# Patient Record
Sex: Female | Born: 1960 | Race: White | Hispanic: No | Marital: Single | Smoking: Never smoker
Health system: Southern US, Community
[De-identification: ages and names within clinical notes are randomized; demographics above are authoritative.]

---

## 1999-11-16 ENCOUNTER — Other Ambulatory Visit: Admission: RE | Admit: 1999-11-16 | Discharge: 1999-11-16 | Payer: Self-pay | Admitting: Family Medicine

## 2000-11-19 ENCOUNTER — Other Ambulatory Visit: Admission: RE | Admit: 2000-11-19 | Discharge: 2000-11-19 | Payer: Self-pay | Admitting: Family Medicine

## 2001-11-22 ENCOUNTER — Other Ambulatory Visit: Admission: RE | Admit: 2001-11-22 | Discharge: 2001-11-22 | Payer: Self-pay | Admitting: Family Medicine

## 2003-01-29 ENCOUNTER — Encounter: Admission: RE | Admit: 2003-01-29 | Discharge: 2003-01-29 | Payer: Self-pay | Admitting: Family Medicine

## 2003-01-29 ENCOUNTER — Encounter: Payer: Self-pay | Admitting: Family Medicine

## 2003-12-21 ENCOUNTER — Other Ambulatory Visit: Admission: RE | Admit: 2003-12-21 | Discharge: 2003-12-21 | Payer: Self-pay | Admitting: Family Medicine

## 2005-01-31 ENCOUNTER — Other Ambulatory Visit: Admission: RE | Admit: 2005-01-31 | Discharge: 2005-01-31 | Payer: Self-pay | Admitting: Family Medicine

## 2006-04-02 ENCOUNTER — Other Ambulatory Visit: Admission: RE | Admit: 2006-04-02 | Discharge: 2006-04-02 | Payer: Self-pay | Admitting: Family Medicine

## 2007-05-21 ENCOUNTER — Other Ambulatory Visit: Admission: RE | Admit: 2007-05-21 | Discharge: 2007-05-21 | Payer: Self-pay | Admitting: Family Medicine

## 2009-01-07 ENCOUNTER — Other Ambulatory Visit: Admission: RE | Admit: 2009-01-07 | Discharge: 2009-01-07 | Payer: Self-pay | Admitting: Family Medicine

## 2018-03-09 ENCOUNTER — Encounter (HOSPITAL_COMMUNITY): Payer: Self-pay | Admitting: Emergency Medicine

## 2018-03-09 ENCOUNTER — Emergency Department (HOSPITAL_COMMUNITY)
Admission: EM | Admit: 2018-03-09 | Discharge: 2018-03-09 | Disposition: A | Discharge status: Home / Self Care | Payer: Self-pay | Attending: Emergency Medicine | Admitting: Emergency Medicine

## 2018-03-09 ENCOUNTER — Emergency Department (HOSPITAL_COMMUNITY): Payer: Self-pay

## 2018-03-09 DIAGNOSIS — G8929 Other chronic pain: Secondary | ICD-10-CM | POA: Insufficient documentation

## 2018-03-09 DIAGNOSIS — Y939 Activity, unspecified: Secondary | ICD-10-CM | POA: Insufficient documentation

## 2018-03-09 DIAGNOSIS — M25511 Pain in right shoulder: Secondary | ICD-10-CM

## 2018-03-09 DIAGNOSIS — R1110 Vomiting, unspecified: Secondary | ICD-10-CM | POA: Insufficient documentation

## 2018-03-09 DIAGNOSIS — Y999 Unspecified external cause status: Secondary | ICD-10-CM | POA: Insufficient documentation

## 2018-03-09 DIAGNOSIS — M19011 Primary osteoarthritis, right shoulder: Secondary | ICD-10-CM | POA: Insufficient documentation

## 2018-03-09 DIAGNOSIS — X58XXXA Exposure to other specified factors, initial encounter: Secondary | ICD-10-CM | POA: Insufficient documentation

## 2018-03-09 DIAGNOSIS — S91001A Unspecified open wound, right ankle, initial encounter: Secondary | ICD-10-CM | POA: Insufficient documentation

## 2018-03-09 DIAGNOSIS — Y929 Unspecified place or not applicable: Secondary | ICD-10-CM | POA: Insufficient documentation

## 2018-03-09 DIAGNOSIS — F10920 Alcohol use, unspecified with intoxication, uncomplicated: Secondary | ICD-10-CM | POA: Insufficient documentation

## 2018-03-09 DIAGNOSIS — M25571 Pain in right ankle and joints of right foot: Secondary | ICD-10-CM | POA: Insufficient documentation

## 2018-03-09 MED ORDER — ONDANSETRON HCL 4 MG/2ML IJ SOLN: 4.0000 mg | Freq: Once | INTRAMUSCULAR | Status: DC

## 2018-03-09 MED ORDER — SULFAMETHOXAZOLE-TRIMETHOPRIM 800-160 MG PO TABS: 1.0000 | ORAL_TABLET | Freq: Two times a day (BID) | ORAL | 0 refills | Status: AC

## 2018-03-09 MED ORDER — ONDANSETRON 8 MG PO TBDP
8.0000 mg | ORAL_TABLET | Freq: Once | ORAL | Status: AC
  Administered 2018-03-09: 8 mg via ORAL
  Filled 2018-03-09: qty 1

## 2018-03-09 NOTE — ED Triage Notes (Signed)
[place] [facility] [place] with complaints of right shoulder and right ankle pain x3 months. ETOH.

## 2018-03-09 NOTE — ED Provider Notes (Signed)
[facility]-EMERGENCY DEPT Provider Note   CSN: [id number] Arrival date & time: 03/09/18  1556     History   Chief Complaint Chief Complaint  Patient presents with  . Alcohol Intoxication  . Ankle Pain    HPI Monique Freeman is a 57 y.o. female with a PMHx of esophageal stricture, neuropathy in feet, and ?CVA in November 2018 (in [place]), who presents to the ED with complaints of R shoulder pain for about 1 month.  LEVEL 5 CAVEAT due to pt being a relatively poor historian.  Patient states that she just recently moved back from [place], she states that she was there last year and had possibly a stroke though she is not sure, she states that she was in the hospital in November for this possible stroke.  She decided to move back here a few days ago.  She came in today because she is having chronic right shoulder pain that has been going on for at least a month.  She states that she fell out of a wheelchair approximately a month ago, but denies any new or recent injuries since then.  She describes the pain as 10/10 constant stabbing nonradiating right shoulder pain that worsens with movement and has been unrelieved with ibuprofen.  She says she had xrays in [place] but she isn't sure what they showed.  She also complains of a wound on her right ankle, she states that last week in [place] she was given an antibiotic (possibly amoxicillin?) for this wound but she lost the rx 2 days ago when she lost her purse.  She denies any ongoing drainage to the area, which had previously been present (at least she was told she had drainage when she got the antibiotic).  She denies any warmth or erythema to the area that she's aware of.  She states that she has a chronic issue with vomiting because of an esophagus issue (?strictures) that she needs to have "dilated"; reports that she vomited 3 times this morning, NBNB, which occurred when she ate some food this morning; she states this is a chronic  issue which is unchanged.  She reports that she's homeless, tried to go to the shelters but hasn't had a place to stay since a few days ago.  She admits that she had a 28oz beer around 3pm today.  She denies SI/HI/AVH or illicit drug use. Denies being a tobacco user.  Denies having any other medical conditions that she's aware of, but again she is a fairly poor historian and therefore this limits evaluation somewhat.  She denies fevers, chills, CP, SOB, abd pain, nausea, diarrhea, constipation, hematemesis, hematuria, dysuria, new/worsening numbness/tingling, focal weakness, or any other complaints at this time.   The history is provided by the patient and medical records. No language interpreter was used.  Alcohol Intoxication  Pertinent negatives include no chest pain, no abdominal pain and no shortness of breath.  Ankle Pain   Pertinent negatives include no numbness.    History reviewed. No pertinent past medical history.  There are no active problems to display for this patient.   History reviewed. No pertinent surgical history.   OB History   None      Home Medications    Prior to Admission medications   Not on File    Family History No family history on file.  Social History Social History   Tobacco Use  . Smoking status: Never Smoker  . Smokeless tobacco: Never Used  Substance Use Topics  . Alcohol use: Yes    Frequency: Never  . Drug use: Not on file     Allergies   Patient has no allergy information on record.   Review of Systems Review of Systems  Constitutional: Negative for chills and fever.  Respiratory: Negative for shortness of breath.   Cardiovascular: Negative for chest pain.  Gastrointestinal: Positive for vomiting. Negative for abdominal pain, constipation, diarrhea and nausea.  Genitourinary: Negative for dysuria and hematuria.  Musculoskeletal: Positive for arthralgias. Negative for joint swelling and myalgias.  Skin: Positive for wound.  Negative for color change.  Allergic/Immunologic: Negative for immunocompromised state.  Neurological: Negative for weakness and numbness.  Psychiatric/Behavioral: Negative for hallucinations and suicidal ideas.   All other systems reviewed and are negative for acute change except as noted in the HPI.    Physical Exam Updated Vital Signs BP 118/74 (BP Location: Left Arm)   Pulse 75   Temp 98.3 F (36.8 C) (Oral)   Resp 18   SpO2 94%   Physical Exam  Constitutional: She is oriented to person, place, and time. Vital signs are normal. She appears well-developed and well-nourished.  Non-toxic appearance. No distress.  Afebrile, nontoxic, NAD  HENT:  Head: Normocephalic and atraumatic.  Mouth/Throat: Oropharynx is clear and moist and mucous membranes are normal.  Eyes: Conjunctivae and EOM are normal. Right eye exhibits no discharge. Left eye exhibits no discharge.  Neck: Normal range of motion. Neck supple.  Cardiovascular: Normal rate, regular rhythm, normal heart sounds and intact distal pulses. Exam reveals no gallop and no friction rub.  No murmur heard. Pulmonary/Chest: Effort normal and breath sounds normal. No respiratory distress. She has no decreased breath sounds. She has no wheezes. She has no rhonchi. She has no rales.  Abdominal: Soft. Normal appearance and bowel sounds are normal. She exhibits no distension. There is no tenderness. There is no rigidity, no rebound, no guarding, no CVA tenderness, no tenderness at McBurney's point and negative Murphy's sign.  Musculoskeletal: Normal range of motion.       Right shoulder: She exhibits tenderness. She exhibits normal range of motion, no swelling, no effusion, no crepitus, no deformity, normal pulse and normal strength.       Right ankle: She exhibits swelling. She exhibits normal range of motion, no ecchymosis, no deformity and normal pulse. Tenderness. Lateral malleolus tenderness found. Achilles tendon normal.  R shoulder with  FROM intact, with mild diffuse TTP around the entire shoulder, no swelling/effusion, no bruising or erythema, no warmth, no crepitus/deformity, negative apley scratch, neg pain with resisted int/ext rotation.  R ankle with FROM intact, with small scab to lateral malleolus with minimal amount of surrounding swelling and faint erythema, no warmth or drainage, no red streaking, no crepitus or deformity, with mild TTP of the lateral malleolus above the scab, but no TTP or swelling of fore foot or calf. No bruising. Achilles intact. Good pedal pulse and cap refill of all toes. Wiggling toes without difficulty.  Strength and sensation grossly intact in all extremities, distal pulses intact.  Soft compartments.   Neurological: She is alert and oriented to person, place, and time. She has normal strength. No sensory deficit.  A&O x3 however seems slightly intoxicated. Speech clear and coherent, not slurred. No focal neuro deficits noted.   Skin: Skin is warm, dry and intact. No rash noted.  Small scab to R ankle as mentioned above  Psychiatric: Her affect is blunt. She is not actively hallucinating.  She expresses no homicidal and no suicidal ideation. She expresses no suicidal plans and no homicidal plans.  Flat affect, but pleasant and cooperative. Denies SI, HI, or AVH, doesn't seem to be responding to internal stimuli.   Nursing note and vitals reviewed.    ED Treatments / Results  Labs (all labs ordered are listed, but only abnormal results are displayed) Labs Reviewed - No data to display  EKG None  Radiology Dg Shoulder Right  Result Date: 03/09/2018 CLINICAL DATA:  Right shoulder pain. EXAM: RIGHT SHOULDER - 2+ VIEW COMPARISON:  None. FINDINGS: There is no evidence of fracture or dislocation. Osteoarthritic changes at the glenohumeral joint and acromioclavicular joint, moderate in severity. Soft tissues are unremarkable. IMPRESSION: No acute fracture or dislocation identified about the right  shoulder. Osteoarthritic changes of the glenohumeral and acromioclavicular joints. Electronically Signed   By: Ted Mcalpine M.D.   On: 03/09/2018 17:45   Dg Ankle Complete Right  Result Date: 03/09/2018 CLINICAL DATA:  Right ankle pain. EXAM: RIGHT ANKLE - COMPLETE 3+ VIEW COMPARISON:  None. FINDINGS: Post sideplate and screw fixation of the lateral malleolus. No evidence of hardware complications. No evidence of acute fracture or dislocation. Mild soft tissue swelling about the lateral malleolus. IMPRESSION: No evidence of acute fracture or dislocation. Prior sideplate and screw fixation of the lateral malleolus. Mild soft tissue swelling about the lateral malleolus. Electronically Signed   By: Ted Mcalpine M.D.   On: 03/09/2018 17:47    Procedures Procedures (including critical care time)  Medications Ordered in ED Medications - No data to display   Initial Impression / Assessment and Plan / ED Course  I have reviewed the triage vital signs and the nursing notes.  Pertinent labs & imaging results that were available during my care of the patient were reviewed by me and considered in my medical decision making (see chart for details).     57 y.o. female here with EtOH and chronic R shoulder/ankle pain. States she she recently moved here from [place], has no where to stay; this pain has been chronic, fell from wheelchair maybe a month ago but she's not sure. Was given abx for a wound on R ankle last week, but lost it after taking it for a week. Has esophagus issue where she will vomit food because she needs dilation done, vomited today but has no nausea or abdominal pain. On exam, mild TTP diffusely around the shoulder, FROM intact, no bruising/swelling/deformity/crepitus, mild TTP to lateral malleolus of R ankle with small scab and faint erythema, no warmth or drainage, minimal swelling, extremities all NVI with soft compartments, pt A&O x3 however appears intoxicated. Abd soft and  nontender. She denies SI/HI/AVH. Will get xrays of R ankle and shoulder and attempt to get in touch with social work/case manager to see if we can get any assistance for her. Will hold off on labs, will give food/drink to ensure she can tolerate it here, and reassess shortly.   6:32 PM R ankle xray with mild soft tissue swelling around lateral malleolus but otherwise no acute findings. Given wound on ankle, will treat for infection, although no definite evidence of cellulitis-- however she had incomplete tx thus far so will ensure full treatment now. R shoulder xray with no acute findings, osteoarthritic changes of the glenohumeral and acromioclavicular joints. Pt eating well here, tolerating PO without ongoing vomiting. She is able to ambulate without difficulty and is clinically sober. Unable to get ahold of case manager/social work since  they're gone for the day, but advised pt to call on Monday to discuss with them whether there is anything they can assist with. Will give list of shelters in the area to help with homelessness. Advised RICE, tylenol/motrin for pain, and proper wound care of her ankle wound. Will send home with abx. Discussed alcohol cessation. F/up with [facility] in 5-7 days to establish care and recheck wound. I explained the diagnosis and have given explicit precautions to return to the ER including for any other new or worsening symptoms. The patient understands and accepts the medical plan as it's been dictated and I have answered their questions. Discharge instructions concerning home care and prescriptions have been given. The patient is STABLE and is discharged to home in good condition.    Final Clinical Impressions(s) / ED Diagnoses   Final diagnoses:  Alcoholic intoxication without complication (HCC)  Chronic right shoulder pain  Osteoarthritis of right shoulder, unspecified osteoarthritis type  Chronic pain of right ankle  Wound of right ankle, initial encounter    ED  Discharge Orders        Ordered    sulfamethoxazole-trimethoprim (BACTRIM DS,SEPTRA DS) 800-160 MG tablet  2 times daily     03/09/18 [address] [place], [place] 03/09/18 1832    Arby Barrette, MD 03/15/18 2311

## 2018-03-09 NOTE — Discharge Instructions (Addendum)
Keep wound clean and dry. Ice and elevate the areas of pain, to help with pain and swelling. Take antibiotic until it is finished. Alternate between tylenol and ibuprofen as needed for pain. Use the list below to find shelters in the area. STOP DRINKING ALCOHOL! Follow-up with [facility] in 5-7 days for wound recheck and to establish medical care. Monitor area for signs of infection to include, but not limited to: increasing pain, spreading redness, drainage/pus, worsening swelling, or fevers. Return to emergency department for emergent changing or worsening symptoms.  You can call on Monday morning to speak with a case manager or social worker to find out if there is any assistance they can provide you with your homelessness issue.

## 2018-03-09 NOTE — ED Notes (Signed)
Patient was given meal tray.  

## 2018-03-09 NOTE — ED Notes (Signed)
Patient became sick on stomach.  PA is aware and recommended I give her a ginger ale to see if she can keep it down.

## 2018-12-02 IMAGING — CR DG SHOULDER 2+V*R*
3 series · 3 of 3 positions shown · non-contrast
Comparison: None.

CLINICAL DATA: Right shoulder pain.

EXAM:
RIGHT SHOULDER - 2+ VIEW

[x shoulder ap right (1 of 3)]
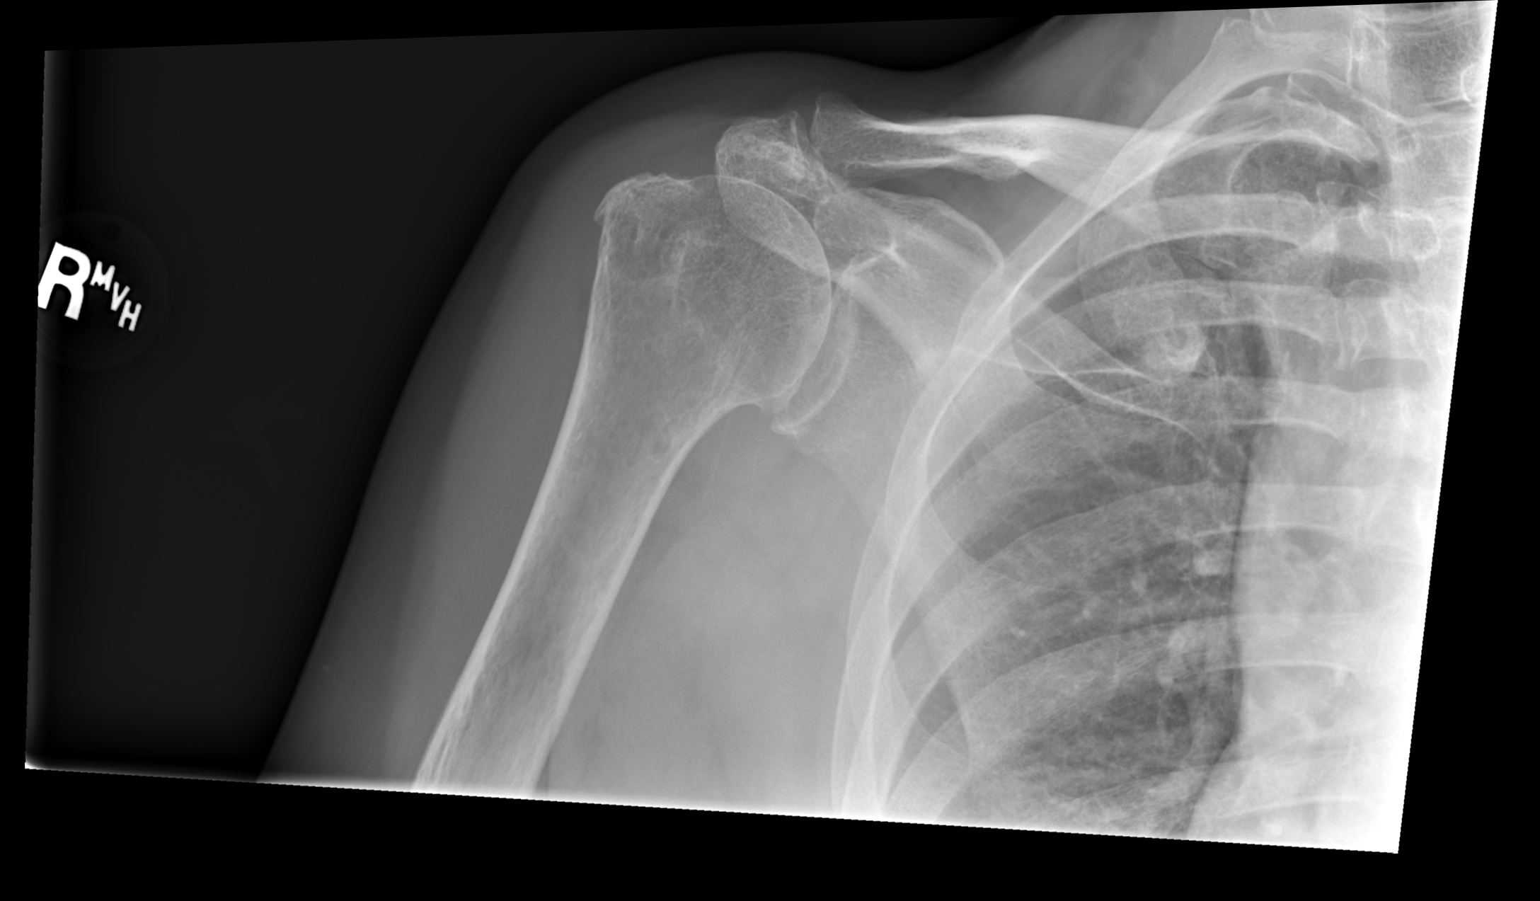

[x shoulder ap right (2 of 3)]
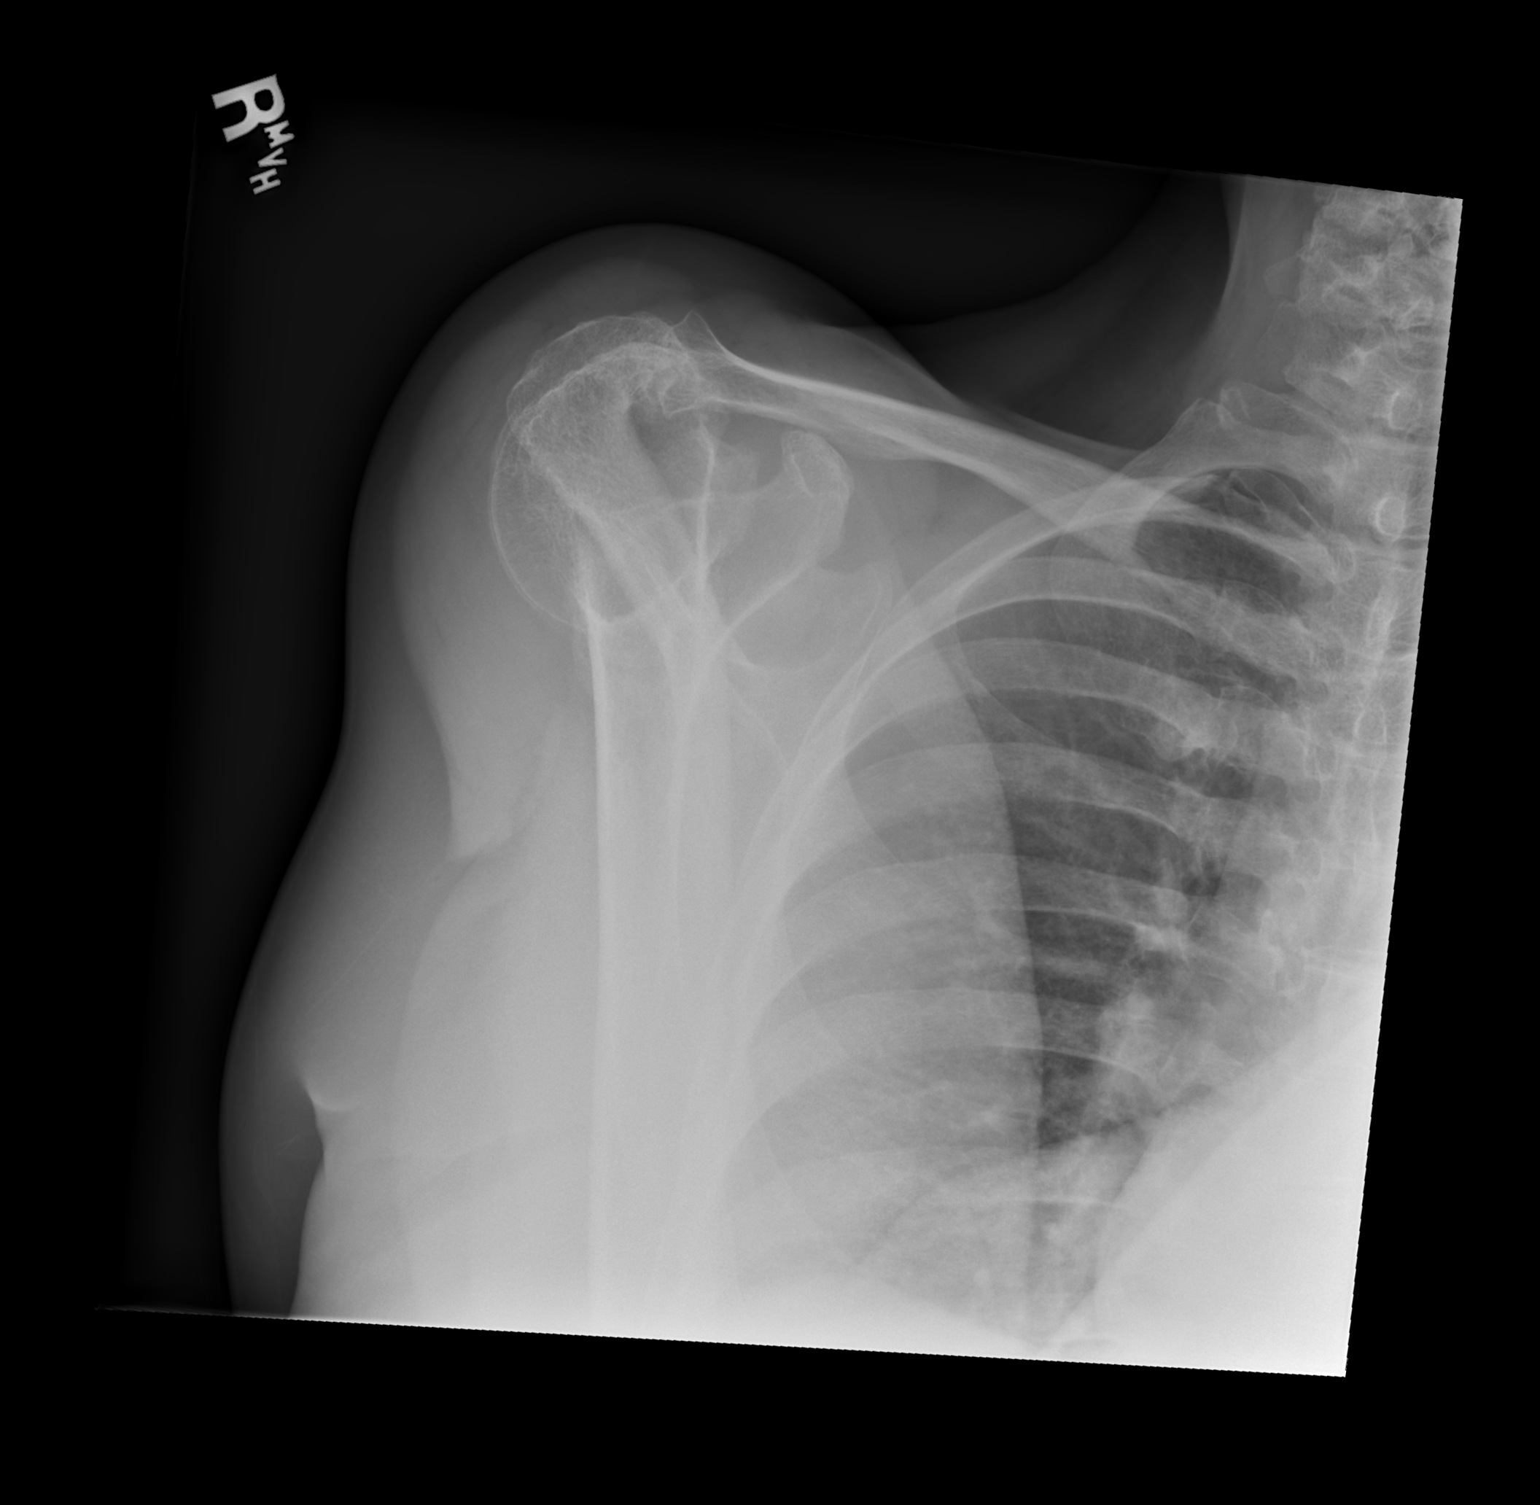

[x shoulder ap right (3 of 3)]
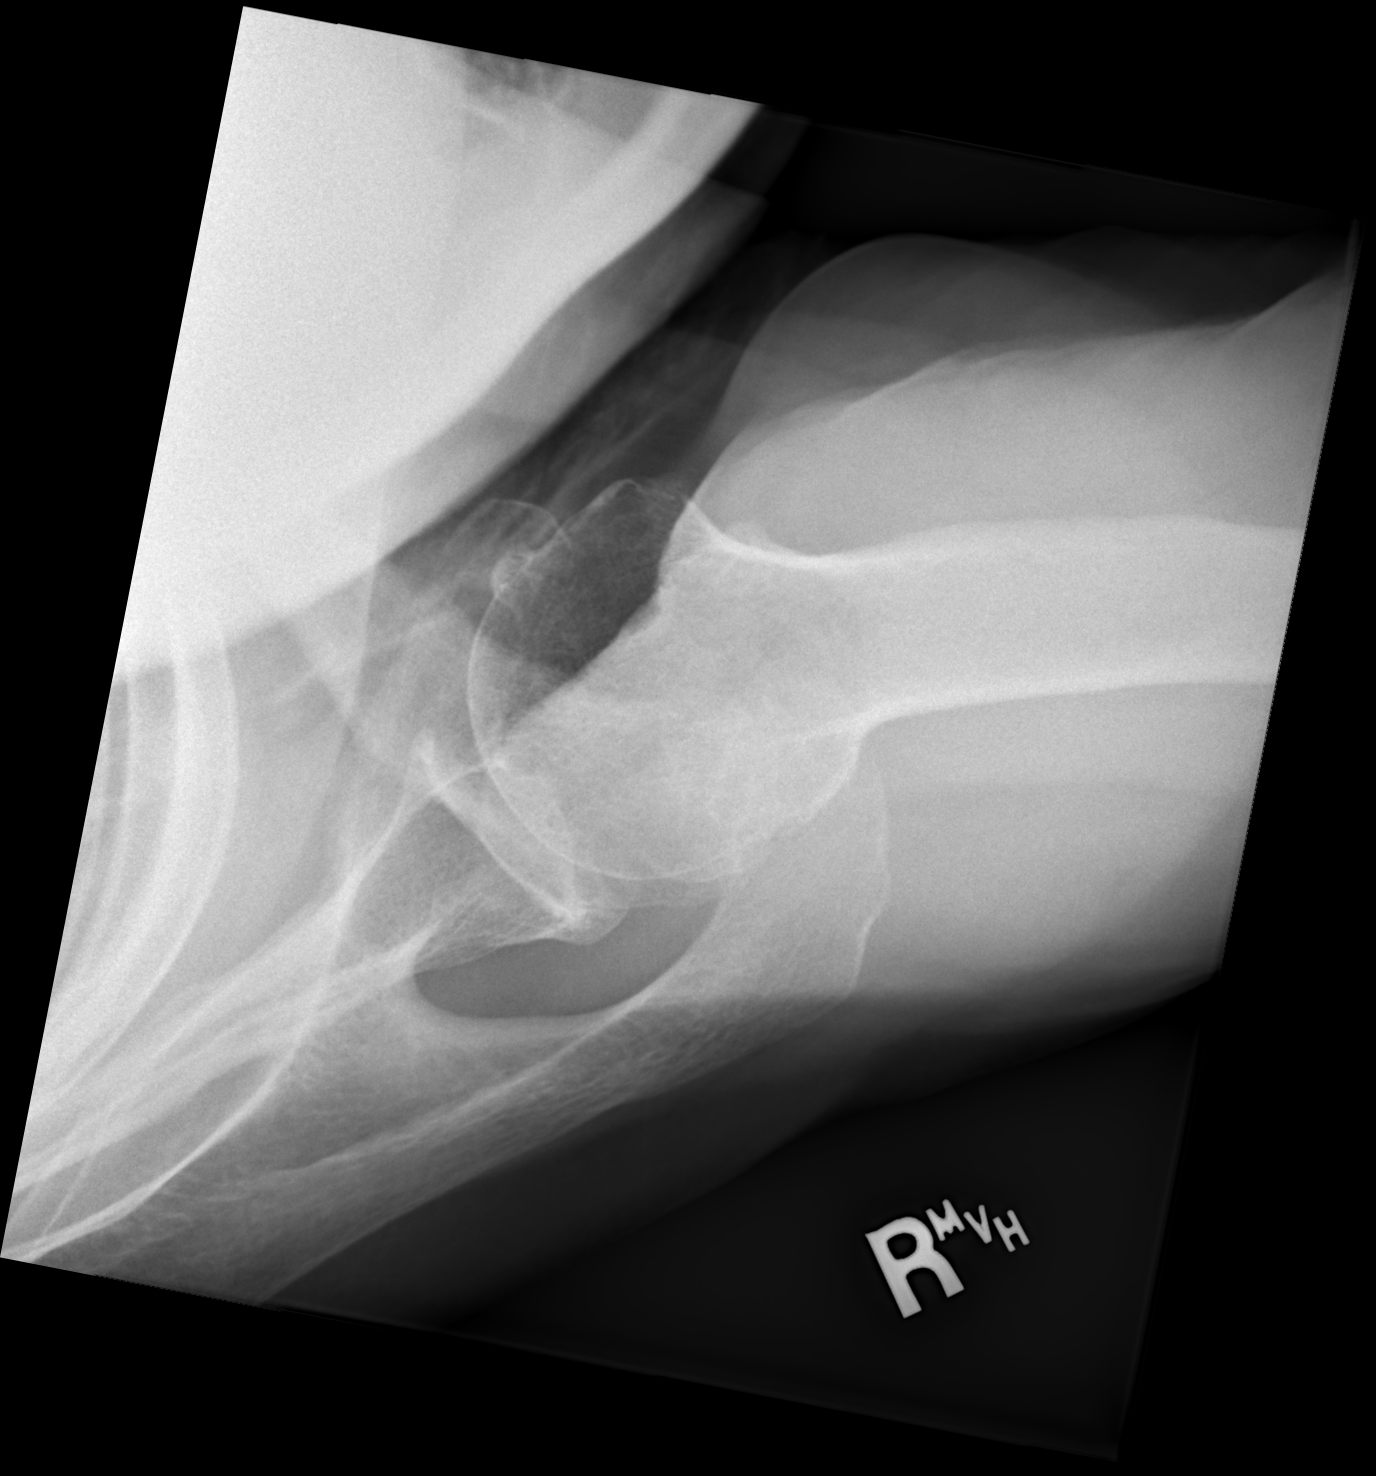

[3 of 3 positions shown; findings below may reference images not displayed]

FINDINGS: There is no evidence of fracture or dislocation. Osteoarthritic
changes at the glenohumeral joint and acromioclavicular joint,
moderate in severity. Soft tissues are unremarkable.
IMPRESSION: No acute fracture or dislocation identified about the right
shoulder.

Osteoarthritic changes of the glenohumeral and acromioclavicular
joints.

## 2018-12-02 IMAGING — CR DG ANKLE COMPLETE 3+V*R*
3 series · 3 of 3 positions shown · non-contrast
Comparison: None.

CLINICAL DATA: Right ankle pain.

EXAM:
RIGHT ANKLE - COMPLETE 3+ VIEW

[x ankle ap right]
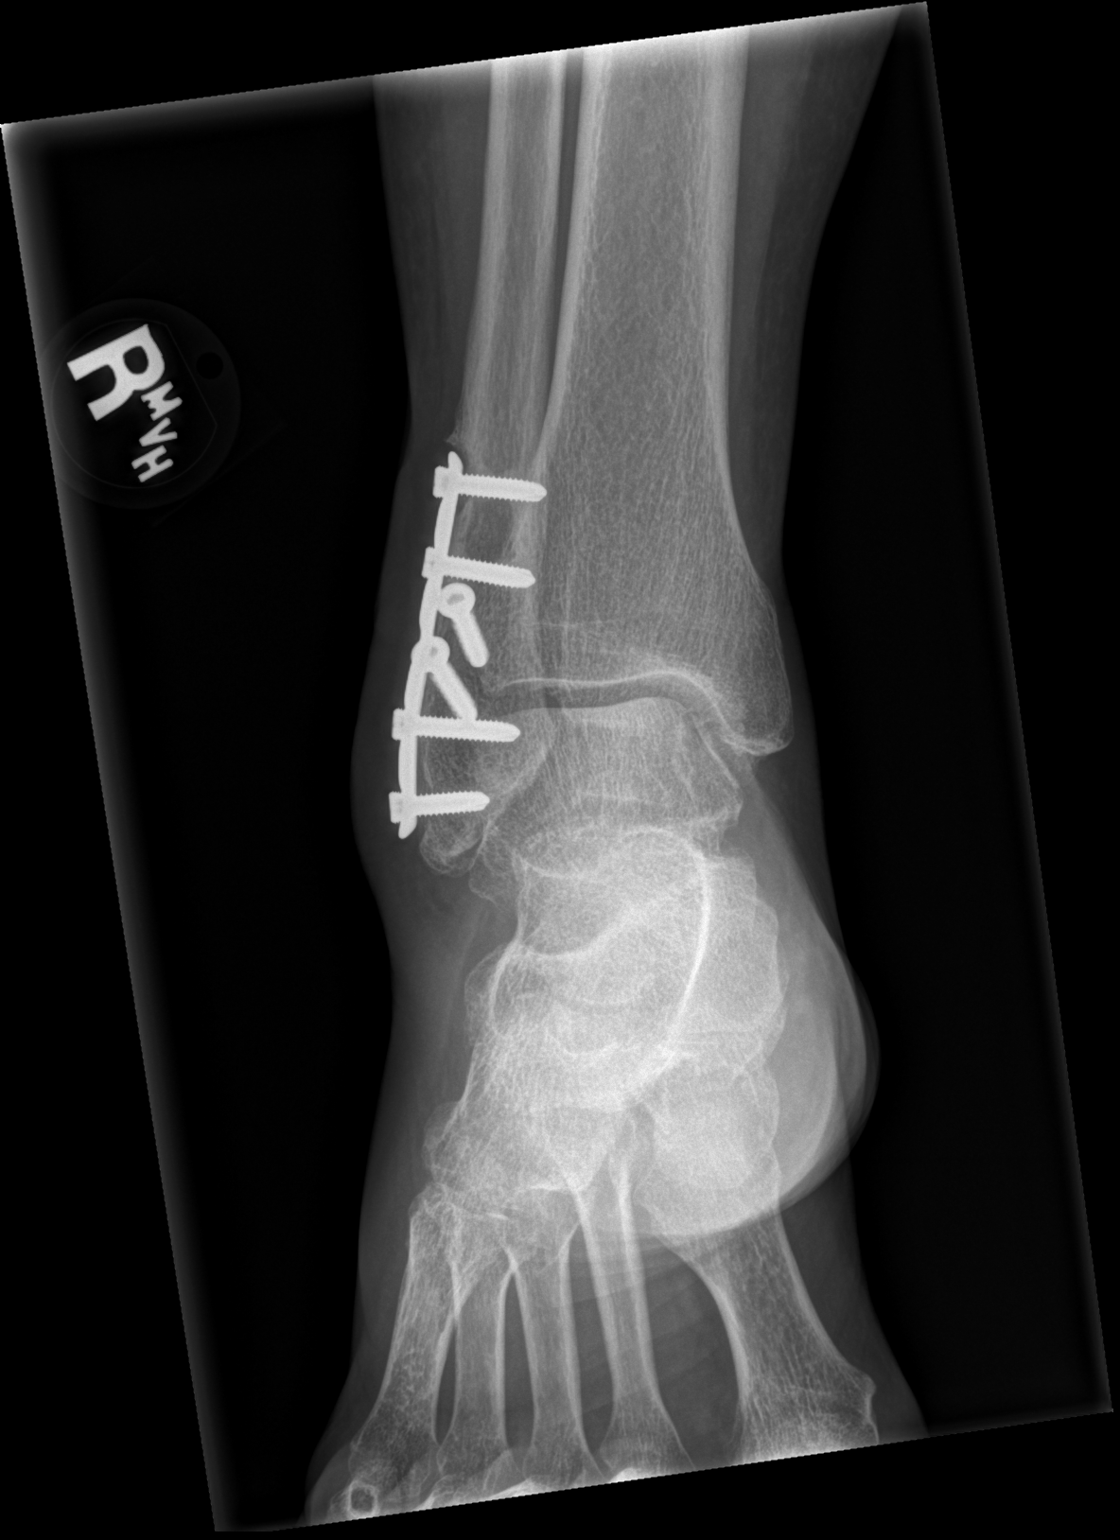

[x ankle obl right]
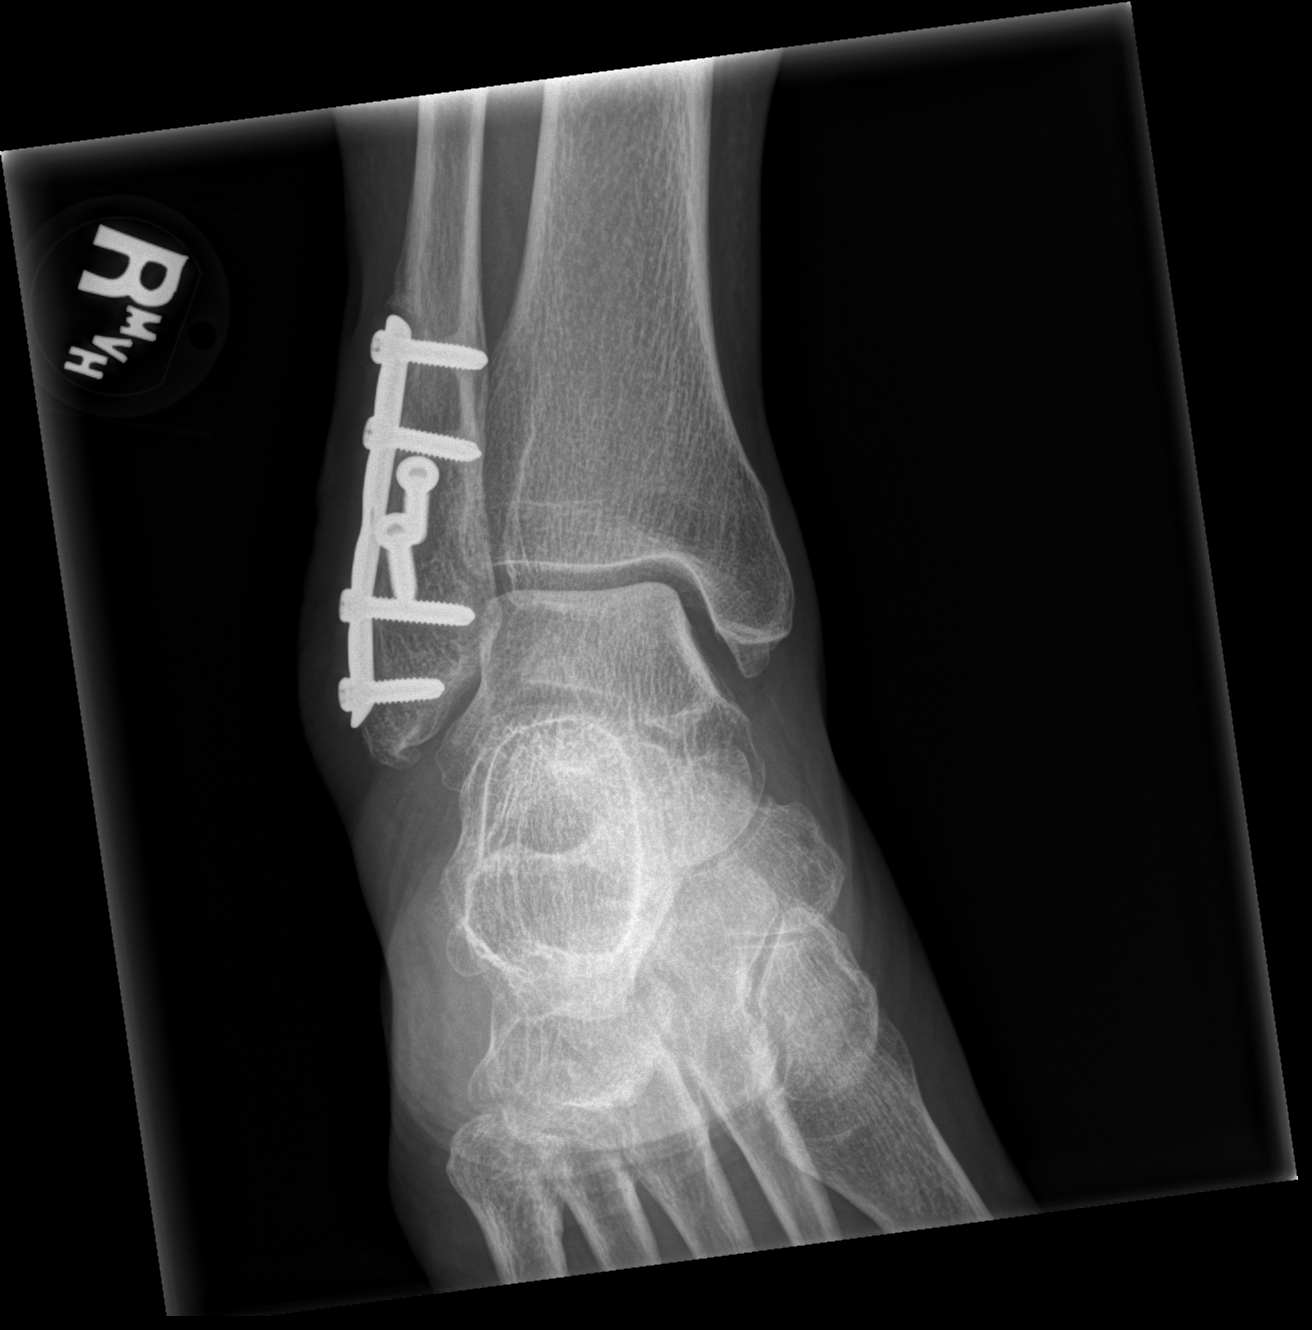

[x ankle lat right]
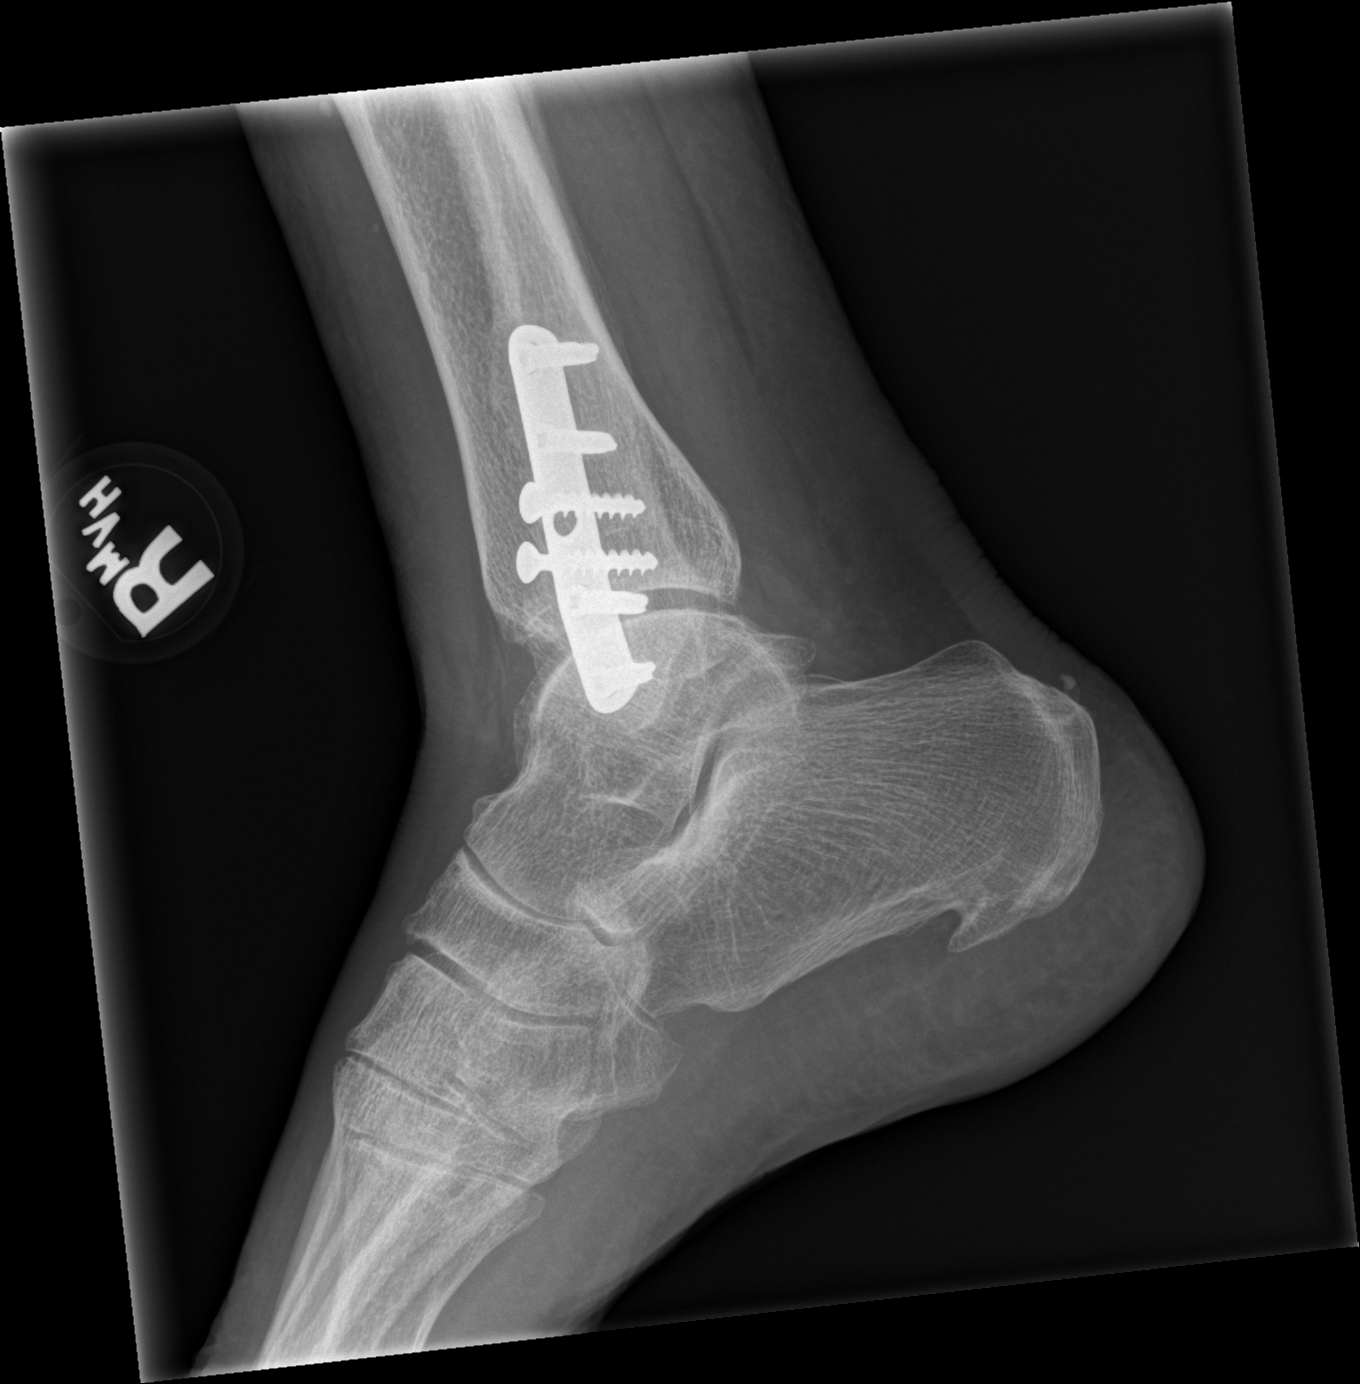

[3 of 3 positions shown; findings below may reference images not displayed]

FINDINGS: Post sideplate and screw fixation of the lateral malleolus. No
evidence of hardware complications. No evidence of acute fracture or
dislocation. Mild soft tissue swelling about the lateral malleolus.
IMPRESSION: No evidence of acute fracture or dislocation.

Prior sideplate and screw fixation of the lateral malleolus.

Mild soft tissue swelling about the lateral malleolus.
